# Patient Record
Sex: Male | Born: 1985 | Race: White | Hispanic: No | Marital: Single | Smoking: Never smoker
Health system: Southern US, Community
[De-identification: ages and names within clinical notes are randomized; demographics above are authoritative.]

## PROBLEM LIST (undated history)

## (undated) HISTORY — PX: TONSILLECTOMY: SUR1361

---

## 2004-03-30 ENCOUNTER — Emergency Department (HOSPITAL_COMMUNITY): Admission: EM | Admit: 2004-03-30 | Discharge: 2004-03-30 | Payer: Self-pay | Admitting: Emergency Medicine

## 2004-04-05 ENCOUNTER — Emergency Department (HOSPITAL_COMMUNITY): Admission: EM | Admit: 2004-04-05 | Discharge: 2004-04-06 | Payer: Self-pay | Admitting: Emergency Medicine

## 2004-04-10 ENCOUNTER — Ambulatory Visit: Payer: Self-pay | Admitting: Internal Medicine

## 2004-04-17 ENCOUNTER — Ambulatory Visit: Payer: Self-pay | Admitting: Internal Medicine

## 2005-12-22 ENCOUNTER — Emergency Department (HOSPITAL_COMMUNITY): Admission: EM | Admit: 2005-12-22 | Discharge: 2005-12-22 | Payer: Self-pay | Admitting: Emergency Medicine

## 2005-12-23 ENCOUNTER — Emergency Department (HOSPITAL_COMMUNITY): Admission: EM | Admit: 2005-12-23 | Discharge: 2005-12-23 | Payer: Self-pay | Admitting: Emergency Medicine

## 2007-12-06 ENCOUNTER — Emergency Department (HOSPITAL_BASED_OUTPATIENT_CLINIC_OR_DEPARTMENT_OTHER): Admission: EM | Admit: 2007-12-06 | Discharge: 2007-12-06 | Payer: Self-pay | Admitting: Emergency Medicine

## 2011-02-20 LAB — CBC
HCT: 45.4
Hemoglobin: 15.9
MCHC: 34.9
MCV: 85
Platelets: 202
RBC: 5.35
RDW: 12.4
WBC: 6.5

## 2011-02-20 LAB — DIFFERENTIAL
Basophils Absolute: 0
Basophils Relative: 1
Eosinophils Absolute: 0.3
Eosinophils Relative: 4
Lymphocytes Relative: 23
Lymphs Abs: 1.5
Monocytes Absolute: 0.4
Monocytes Relative: 7
Neutro Abs: 4.3
Neutrophils Relative %: 66

## 2011-02-20 LAB — BASIC METABOLIC PANEL
BUN: 9
CO2: 27
Calcium: 9.7
Chloride: 106
Creatinine, Ser: 0.9
GFR calc Af Amer: >60
GFR calc non Af Amer: >60
Glucose, Bld: 107 — ABNORMAL HIGH
Potassium: 4.4
Sodium: 142

## 2011-02-20 LAB — D-DIMER, QUANTITATIVE: D-Dimer, Quant: 0.28

## 2014-08-27 ENCOUNTER — Emergency Department (HOSPITAL_COMMUNITY)
Admission: EM | Admit: 2014-08-27 | Discharge: 2014-08-27 | Disposition: A | Discharge status: Home / Self Care | Payer: Self-pay | Attending: Emergency Medicine | Admitting: Emergency Medicine

## 2014-08-27 ENCOUNTER — Emergency Department (HOSPITAL_COMMUNITY): Payer: Self-pay

## 2014-08-27 ENCOUNTER — Encounter (HOSPITAL_COMMUNITY): Payer: Self-pay | Admitting: Cardiology

## 2014-08-27 DIAGNOSIS — Z880 Allergy status to penicillin: Secondary | ICD-10-CM | POA: Insufficient documentation

## 2014-08-27 DIAGNOSIS — L600 Ingrowing nail: Secondary | ICD-10-CM | POA: Insufficient documentation

## 2014-08-27 DIAGNOSIS — Z792 Long term (current) use of antibiotics: Secondary | ICD-10-CM | POA: Insufficient documentation

## 2014-08-27 DIAGNOSIS — M25571 Pain in right ankle and joints of right foot: Secondary | ICD-10-CM | POA: Insufficient documentation

## 2014-08-27 MED ORDER — BUPIVACAINE HCL (PF) 0.25 % IJ SOLN
20.0000 mL | Freq: Once | INTRAMUSCULAR | Status: AC
  Administered 2014-08-27: 20 mL
  Filled 2014-08-27: qty 30

## 2014-08-27 MED ORDER — PREDNISONE 10 MG PO TABS: 20.0000 mg | ORAL_TABLET | Freq: Two times a day (BID) | ORAL | Status: DC

## 2014-08-27 MED ORDER — SULFAMETHOXAZOLE-TRIMETHOPRIM 800-160 MG PO TABS: 1.0000 | ORAL_TABLET | Freq: Two times a day (BID) | ORAL | Status: AC

## 2014-08-27 MED ORDER — LIDOCAINE HCL (PF) 1 % IJ SOLN
5.0000 mL | Freq: Once | INTRAMUSCULAR | Status: AC
  Administered 2014-08-27: 5 mL
  Filled 2014-08-27: qty 5

## 2014-08-27 MED ORDER — HYDROCODONE-ACETAMINOPHEN 5-325 MG PO TABS: 1.0000 | ORAL_TABLET | ORAL | Status: DC | PRN

## 2014-08-27 MED ORDER — POVIDONE-IODINE 10 % EX SOLN
CUTANEOUS | Status: AC
  Administered 2014-08-27: 14:00:00
  Filled 2014-08-27: qty 118

## 2014-08-27 MED ORDER — PREDNISONE 20 MG PO TABS
40.0000 mg | ORAL_TABLET | Freq: Once | ORAL | Status: AC
  Administered 2014-08-27: 40 mg via ORAL
  Filled 2014-08-27: qty 2

## 2014-08-27 NOTE — ED Notes (Signed)
Right foot swollen and red.

## 2014-08-27 NOTE — ED Provider Notes (Signed)
CSN: [id number]     Arrival date & time 08/27/14  1043 History  This chart was scribed for non-physician practitioner Kerrie BuffaloHope Gaddiel Cullens, NP working with Donnetta HutchingBrian Cook, MD by Murriel HopperAlec Bankhead, ED Scribe. This patient was seen in room APFT21/APFT21 and the patient's care was started at 12:01 PM.     Chief Complaint  Patient presents with  . Foot Pain     The history is provided by the patient. No language interpreter was used.     HPI Comments: Kenneth Schneider is a 29 y.o. male who presents to the Emergency Department complaining of worsening right foot, ankle, and lower leg pain that has been present for a week. Pt states that his pain began with what seemed to be an ingrown toenail and then proceeded to radiate to his ankle and lower leg. Pt states he cannot perform weight-bearing activity, and denies any known injury to the area. Pt states he has not taken any medication to treat the pain.    History reviewed. No pertinent past medical history. History reviewed. No pertinent past surgical history. History reviewed. No pertinent family history. History  Substance Use Topics  . Smoking status: Never Smoker   . Smokeless tobacco: Not on file  . Alcohol Use: Yes     Comment: rarely     Review of Systems  Musculoskeletal: Positive for arthralgias.       Right foot/ankle pain and ingrown infected great toenail.  all other systems negative    Allergies  Penicillins  Home Medications   Prior to Admission medications   Medication Sig Start Date End Date Taking? Authorizing Provider  HYDROcodone-acetaminophen (NORCO/VICODIN) 5-325 MG per tablet Take 1 tablet by mouth every 4 (four) hours as needed. 08/27/14   Bodhi Stenglein Orlene OchM Gwyndolyn Guilford, NP  predniSONE (DELTASONE) 10 MG tablet Take 2 tablets (20 mg total) by mouth 2 (two) times daily with a meal. 08/27/14   Buzz Axel Orlene OchM Azile Minardi, NP  sulfamethoxazole-trimethoprim (BACTRIM DS,SEPTRA DS) 800-160 MG per tablet Take 1 tablet by mouth 2 (two) times daily. 08/27/14 09/03/14  Hoang Reich  Orlene OchM Nykole Matos, NP   BP 124/71 mmHg  Pulse 79  Temp(Src) 98.2 F (36.8 C) (Oral)  Resp 20  Ht 5\' 10"  (1.778 m)  Wt 230 lb (104.327 kg)  BMI 33.00 kg/m2  SpO2 100% Physical Exam  Constitutional: He is oriented to person, place, and time. He appears well-developed and well-nourished.  HENT:  Head: Normocephalic and atraumatic.  Cardiovascular: Normal rate.   Pulmonary/Chest: Effort normal.  Abdominal: He exhibits no distension.  Musculoskeletal:       Right foot: There is tenderness and swelling. Decreased range of motion: due to pain.       Feet:  2+ pedal pulse Adequate circulation Tender medial aspect of the right foot that radiates to the medial malleolus, full passive range of motion with minimal pain. There is small amount of swelling noted to the medial aspect of the foot.   Infected ingrown toenail to great toe  No calf pain on exam  Referred pain from ankle up calf only with ambulation, unable to reproduce the pain with squeezing the calf.   Neurological: He is alert and oriented to person, place, and time.  Skin: Skin is warm and dry.  Psychiatric: He has a normal mood and affect.  Nursing note and vitals reviewed.   ED Course  NAIL REMOVAL Date/Time: 08/27/2014 2:58 PM Performed by: Janne NapoleonNEESE, Cyndia Degraff M Authorized by: Janne NapoleonNEESE, Tomislav Micale M Consent: Verbal consent obtained. Risks and  benefits: risks, benefits and alternatives were discussed Consent given by: patient Patient understanding: patient states understanding of the procedure being performed Required items: required blood products, implants, devices, and special equipment available Patient identity confirmed: verbally with patient Location: right foot Location details: right big toe Anesthesia: digital block Local anesthetic: bupivacaine 0.25% without epinephrine and lidocaine 1% without epinephrine Anesthetic total: 5 ml Patient sedated: no Preparation: skin prepped with Betadine and sterile field established Nail removal  amount: 1/8. Wedge excision of skin of nail fold: yes Nail bed sutured: no Nail matrix removed: partial Dressing: 4x4 Patient tolerance: Patient tolerated the procedure well with no immediate complications   (including critical care time)   DIAGNOSTIC STUDIES: Oxygen Saturation is 100% on room air, normal by my interpretation.    COORDINATION OF CARE: 12:05 PM Discussed treatment plan with pt at bedside and pt agreed to plan.  Dr. Adriana Simas in to examine the patient. He agrees with assessment/plan.  ASO, pain management, antibiotics Labs Review Labs Reviewed - No data to display  Imaging Review Dg Foot Complete Right  08/27/2014   CLINICAL DATA:  Right foot pain for 1 week, swelling, no trauma  EXAM: RIGHT FOOT COMPLETE - 3+ VIEW  COMPARISON:  None.  FINDINGS: There is no evidence of fracture or dislocation. There is no evidence of arthropathy or other focal bone abnormality. Soft tissues are unremarkable.  IMPRESSION: Negative.   Electronically Signed   By: Christiana Pellant M.D.   On: 08/27/2014 12:34     MDM  29 y.o. male with infected ingrown toe nail of the right great toe and right medial foot/ankle pain. Suspect foot/ankle pain not related to the infected toe. Will treat for infection of the toe and treat for gouty arthritis of the foot/ankle. Discussed with the patient and all questioned fully answered. He will call me if any problems arise. Stable for d/c without neurovascular compromise.   Final diagnoses:  Ingrown right big toenail  Right ankle pain   I personally performed the services described in this documentation, which was scribed in my presence. The recorded information has been reviewed and is accurate.    Miner, NP 08/28/14 1332  Donnetta Hutching, MD 08/29/14 [phone]

## 2014-08-27 NOTE — Discharge Instructions (Signed)
Take the medications as directed. Do not take the narcotic if driving as it will make you sleepy.   Arthritis, Nonspecific Arthritis is pain, redness, warmth, or puffiness (inflammation) of a joint. The joint may be stiff or hurt when you move it. One or more joints may be affected. There are many types of arthritis. Your doctor may not know what type you have right away. The most common cause of arthritis is wear and tear on the joint (osteoarthritis). HOME CARE   Only take medicine as told by your doctor.  Rest the joint as much as possible.  Raise (elevate) your joint if it is puffy.  Use crutches if the painful joint is in your leg.  Drink enough fluids to keep your pee (urine) clear or pale yellow.  Follow your doctor's diet instructions.  Use cold packs for very bad joint pain for 10 to 15 minutes every hour. Ask your doctor if it is okay for you to use hot packs.  Exercise as told by your doctor.  Take a warm shower if you have stiffness in the morning.  Move your sore joints throughout the day. GET HELP RIGHT AWAY IF:   You have a fever.  You have very bad joint pain, puffiness, or redness.  You have many joints that are painful and puffy.  You are not getting better with treatment.  You have very bad back pain or leg weakness.  You cannot control when you poop (bowel movement) or pee (urinate).  You do not feel better in 24 hours or are getting worse.  You are having side effects from your medicine. MAKE SURE YOU:   Understand these instructions.  Will watch your condition.  Will get help right away if you are not doing well or get worse. Document Released: 08/06/2009 Document Revised: 11/11/2011 Document Reviewed: 08/06/2009 [facility] Patient Information 2015 [place], [place]. This information is not intended to replace advice given to you by your health care provider. Make sure you discuss any questions you have with your health care provider.

## 2015-02-18 ENCOUNTER — Emergency Department (HOSPITAL_COMMUNITY)
Admission: EM | Admit: 2015-02-18 | Discharge: 2015-02-18 | Disposition: A | Discharge status: Home / Self Care | Payer: Self-pay | Attending: Emergency Medicine | Admitting: Emergency Medicine

## 2015-02-18 ENCOUNTER — Emergency Department (HOSPITAL_COMMUNITY): Payer: Self-pay

## 2015-02-18 ENCOUNTER — Encounter (HOSPITAL_COMMUNITY): Payer: Self-pay | Admitting: *Deleted

## 2015-02-18 DIAGNOSIS — N492 Inflammatory disorders of scrotum: Secondary | ICD-10-CM | POA: Insufficient documentation

## 2015-02-18 DIAGNOSIS — R6883 Chills (without fever): Secondary | ICD-10-CM | POA: Insufficient documentation

## 2015-02-18 DIAGNOSIS — N50819 Testicular pain, unspecified: Secondary | ICD-10-CM

## 2015-02-18 DIAGNOSIS — Z880 Allergy status to penicillin: Secondary | ICD-10-CM | POA: Insufficient documentation

## 2015-02-18 LAB — CBC WITH DIFFERENTIAL/PLATELET
Basophils Absolute: 0 10*3/uL (ref 0.0–0.1)
Basophils Relative: 0 %
Eosinophils Absolute: 0.5 10*3/uL (ref 0.0–0.7)
Eosinophils Relative: 4 %
HCT: 41.6 % (ref 39.0–52.0)
Hemoglobin: 13.9 g/dL (ref 13.0–17.0)
Lymphocytes Relative: 18 %
Lymphs Abs: 2.1 10*3/uL (ref 0.7–4.0)
MCH: 29.8 pg (ref 26.0–34.0)
MCHC: 33.4 g/dL (ref 30.0–36.0)
MCV: 89.1 fL (ref 78.0–100.0)
Monocytes Absolute: 1.1 10*3/uL — ABNORMAL HIGH (ref 0.1–1.0)
Monocytes Relative: 9 %
Neutro Abs: 8.2 10*3/uL — ABNORMAL HIGH (ref 1.7–7.7)
Neutrophils Relative %: 69 %
Platelets: 190 10*3/uL (ref 150–400)
RBC: 4.67 MIL/uL (ref 4.22–5.81)
RDW: 13.5 % (ref 11.5–15.5)
WBC: 11.9 10*3/uL — ABNORMAL HIGH (ref 4.0–10.5)

## 2015-02-18 LAB — BASIC METABOLIC PANEL
Anion gap: 6 (ref 5–15)
BUN: 12 mg/dL (ref 6–20)
CO2: 27 mmol/L (ref 22–32)
Calcium: 8.5 mg/dL — ABNORMAL LOW (ref 8.9–10.3)
Chloride: 106 mmol/L (ref 101–111)
Creatinine, Ser: 0.86 mg/dL (ref 0.61–1.24)
GFR calc Af Amer: >60 mL/min (ref >60)
GFR calc non Af Amer: >60 mL/min (ref >60)
Glucose, Bld: 104 mg/dL — ABNORMAL HIGH (ref 65–99)
Potassium: 4.2 mmol/L (ref 3.5–5.1)
Sodium: 139 mmol/L (ref 135–145)

## 2015-02-18 MED ORDER — SULFAMETHOXAZOLE-TRIMETHOPRIM 800-160 MG PO TABS: 1.0000 | ORAL_TABLET | Freq: Two times a day (BID) | ORAL | Status: AC

## 2015-02-18 MED ORDER — CEFTRIAXONE SODIUM 1 G IJ SOLR
1.0000 g | Freq: Once | INTRAMUSCULAR | Status: AC
  Administered 2015-02-18: 1 g via INTRAVENOUS
  Filled 2015-02-18: qty 10

## 2015-02-18 MED ORDER — ONDANSETRON HCL 4 MG/2ML IJ SOLN
4.0000 mg | Freq: Once | INTRAMUSCULAR | Status: DC
  Filled 2015-02-18: qty 2

## 2015-02-18 MED ORDER — ONDANSETRON HCL 4 MG/2ML IJ SOLN
4.0000 mg | Freq: Once | INTRAMUSCULAR | Status: AC
  Administered 2015-02-18: 4 mg via INTRAVENOUS

## 2015-02-18 MED ORDER — CEPHALEXIN 500 MG PO CAPS: 500.0000 mg | ORAL_CAPSULE | Freq: Four times a day (QID) | ORAL | Status: DC

## 2015-02-18 MED ORDER — VANCOMYCIN HCL IN DEXTROSE 1-5 GM/200ML-% IV SOLN
1000.0000 mg | Freq: Once | INTRAVENOUS | Status: AC
  Administered 2015-02-18: 1000 mg via INTRAVENOUS
  Filled 2015-02-18: qty 200

## 2015-02-18 MED ORDER — SODIUM CHLORIDE 0.9 % IV BOLUS (SEPSIS)
1000.0000 mL | Freq: Once | INTRAVENOUS | Status: AC
  Administered 2015-02-18: 1000 mL via INTRAVENOUS

## 2015-02-18 NOTE — ED Provider Notes (Signed)
CSN: [id number]     Arrival date & time 02/18/15  1044 History  This chart was scribed for Kenneth Octave, MD by Murriel Hopper, ED Scribe. This patient was seen in room APA11/APA11 and the patient's care was started at 11:28 AM.    Chief Complaint  Patient presents with  . Abscess      The history is provided by the patient. No language interpreter was used.   HPI Comments: Kenneth Schneider is a 29 y.o. male who presents to the Emergency Department complaining of an abscess on the right side of his scrotum that has been present for 3 days with associated chills. Pt states he had an area on his scrotum that looked like a pimple that he popped 3 days ago, and reports the area has increased in size, tenderness, and redness since then. Pt states he has used a warm compress and taken warm baths for the last three days but states that doing so has not helped with his symptoms. Pt denies hematuria or dysuria, penile discharge, fever, vomiting. Pt states he is sexually active with one partner and had sex last 4-5 days ago. Pt states he is allergic to ibuprofen and advil.    History reviewed. No pertinent past medical history. History reviewed. No pertinent past surgical history. No family history on file. Social History  Substance Use Topics  . Smoking status: Never Smoker   . Smokeless tobacco: None  . Alcohol Use: Yes     Comment: rarely     Review of Systems  A complete 10 system review of systems was obtained and all systems are negative except as noted in the HPI and PMH.    Allergies  Codeine; Ibuprofen; Penicillins; and Vancomycin  Home Medications   Prior to Admission medications   Medication Sig Start Date End Date Taking? Authorizing Provider  cephALEXin (KEFLEX) 500 MG capsule Take 1 capsule (500 mg total) by mouth 4 (four) times daily. 02/18/15   Kenneth Octave, MD  predniSONE (DELTASONE) 10 MG tablet Take 2 tablets (20 mg total) by mouth 2 (two) times daily with a  meal. Patient not taking: Reported on 02/18/2015 08/27/14   [name], NP  sulfamethoxazole-trimethoprim (BACTRIM DS,SEPTRA DS) 800-160 MG per tablet Take 1 tablet by mouth 2 (two) times daily. 02/18/15 02/25/15  Kenneth Octave, MD   BP 120/74 mmHg  Pulse 99  Temp(Src) 98.4 F (36.9 C) (Oral)  Resp 18  Ht  (1.778 m)  Wt 225 lb (102.059 kg)  BMI 32.28 kg/m2  SpO2 99% Physical Exam  Constitutional: He is oriented to person, place, and time. He appears well-developed and well-nourished. No distress.  HENT:  Head: Normocephalic and atraumatic.  Mouth/Throat: Oropharynx is clear and moist. No oropharyngeal exudate.  Eyes: Conjunctivae and EOM are normal. Pupils are equal, round, and reactive to light.  Neck: Normal range of motion. Neck supple.  No meningismus.  Cardiovascular: Normal rate, regular rhythm, normal heart sounds and intact distal pulses.   No murmur heard. Pulmonary/Chest: Effort normal and breath sounds normal. No respiratory distress.  Abdominal: Soft. There is no tenderness. There is no rebound and no guarding.  Genitourinary: No penile tenderness.  No testicular tenderness  Musculoskeletal: Normal range of motion. He exhibits no edema or tenderness.  Neurological: He is alert and oriented to person, place, and time. No cranial nerve deficit. He exhibits normal muscle tone. Coordination normal.  No ataxia on finger to nose bilaterally. No pronator drift. 5/5 strength throughout.  CN 2-12 intact. Negative Romberg. Equal grip strength. Sensation intact. Gait is normal.   Skin: Skin is warm.  3x4 cm area of erythema and induration to right side of scrotum  Psychiatric: He has a normal mood and affect. His behavior is normal.  Nursing note and vitals reviewed.   ED Course  Procedures (including critical care time)  DIAGNOSTIC STUDIES: Oxygen Saturation is 100% on room air, normal by my interpretation.    COORDINATION OF CARE: 11:37 AM Discussed treatment plan  with pt at bedside and pt agreed to plan.   Labs Review Labs Reviewed  CBC WITH DIFFERENTIAL/PLATELET - Abnormal; Notable for the following:    WBC 11.9 (*)    Neutro Abs 8.2 (*)    Monocytes Absolute 1.1 (*)    All other components within normal limits  BASIC METABOLIC PANEL - Abnormal; Notable for the following:    Glucose, Bld 104 (*)    Calcium 8.5 (*)    All other components within normal limits  URINALYSIS, ROUTINE W REFLEX MICROSCOPIC (NOT AT [facility])    Imaging Review US Scrotum  02/18/2015   CLINICAL DATA:  Patient with bilateral testicular pain.  EXAM: ULTRASOUND OF SCROTUM  TECHNIQUE: Complete ultrasound examination of the testicles, epididymis, and other scrotal structures was performed.  COMPARISON:  CT 07/16/2004  FINDINGS: Right testicle  Measurements: 4.3 x 2.2 x 3.0 cm. No mass or microlithiasis visualized.  Left testicle  Measurements: 4.4 x 2.4 x 2.4 cm. No mass or microlithiasis visualized.  Right epididymis: Probable small epididymal head cysts versus spermatoceles.  Left epididymis: Probable small epididymal head cysts versus spermatoceles.  Hydrocele:  None visualized.  Varicocele:  None visualized.  There is marked thickening of scrotum, particularly overlying the right testicle.  IMPRESSION: Marked skin thickening of the scrotum, particularly overlying right testicle. Findings are concerning for infectious process such as cellulitis.  Normal sonographic appearance of the testicles bilaterally without evidence for torsion.   Electronically Signed   By: Annia Belt M.D.   On: 02/18/2015 12:41   [place] Art/ven Flow Abd Pelv Doppler  02/18/2015   CLINICAL DATA:  Patient with bilateral testicular pain.  EXAM: ULTRASOUND OF SCROTUM  TECHNIQUE: Complete ultrasound examination of the testicles, epididymis, and other scrotal structures was performed.  COMPARISON:  CT 07/16/2004  FINDINGS: Right testicle  Measurements: 4.3 x 2.2 x 3.0 cm. No mass or microlithiasis visualized.  Left  testicle  Measurements: 4.4 x 2.4 x 2.4 cm. No mass or microlithiasis visualized.  Right epididymis: Probable small epididymal head cysts versus spermatoceles.  Left epididymis: Probable small epididymal head cysts versus spermatoceles.  Hydrocele:  None visualized.  Varicocele:  None visualized.  There is marked thickening of scrotum, particularly overlying the right testicle.  IMPRESSION: Marked skin thickening of the scrotum, particularly overlying right testicle. Findings are concerning for infectious process such as cellulitis.  Normal sonographic appearance of the testicles bilaterally without evidence for torsion.   Electronically Signed   By: Annia Belt M.D.   On: 02/18/2015 12:41   I have personally reviewed and evaluated these images and lab results as part of my medical decision-making.   EKG Interpretation None      MDM   Final diagnoses:  Testicle pain  Cellulitis of scrotum   pain, redness and swelling to right scrotum for 3 days. No fever or vomiting. Patient is not diabetic.  Area of cellulitis to right hemiscrotum. There is no fluctuance. There is no drainable fluid collection ultrasound. Blood  flow to testicles normal.  Patient given IV vancomycin and Rocephin in the ED. Discussed with Dr. Vernie Ammons of urology. He agrees with outpatient follow-up given patient's well appearance.  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 No difficulty breathing or swallowing. Added to allergy list.  Bactrim and keflex for home use. followup with urology in 2 days. Return precautions discussed.  BP 120/74 mmHg  Pulse 99  Temp(Src) 98.4 F (36.9 C) (Oral)  Resp 18  Ht  (1.778 m)  Wt 225 lb (102.059 kg)  BMI 32.28 kg/m2  SpO2 99%     I personally performed the services described in this documentation, which was scribed in my presence. The recorded information has been reviewed and is accurate.   Kenneth Octave, MD 02/18/15 [phone]

## 2015-02-18 NOTE — Discharge Instructions (Signed)
Cellulitis Take the antibiotics as prescribed. Follow up with the urologist this week. Return to the ED if you develop new or worsening symptoms. Cellulitis is an infection of the skin and the tissue beneath it. The infected area is usually red and tender. Cellulitis occurs most often in the arms and lower legs.  CAUSES  Cellulitis is caused by bacteria that enter the skin through cracks or cuts in the skin. The most common types of bacteria that cause cellulitis are staphylococci and streptococci. SIGNS AND SYMPTOMS   Redness and warmth.  Swelling.  Tenderness or pain.  Fever. DIAGNOSIS  Your health care provider can usually determine what is wrong based on a physical exam. Blood tests may also be done. TREATMENT  Treatment usually involves taking an antibiotic medicine. HOME CARE INSTRUCTIONS   Take your antibiotic medicine as directed by your health care provider. Finish the antibiotic even if you start to feel better.  Keep the infected arm or leg elevated to reduce swelling.  Apply a warm cloth to the affected area up to 4 times per day to relieve pain.  Take medicines only as directed by your health care provider.  Keep all follow-up visits as directed by your health care provider. SEEK MEDICAL CARE IF:   You notice red streaks coming from the infected area.  Your red area gets larger or turns dark in color.  Your bone or joint underneath the infected area becomes painful after the skin has healed.  Your infection returns in the same area or another area.  You notice a swollen bump in the infected area.  You develop new symptoms.  You have a fever. SEEK IMMEDIATE MEDICAL CARE IF:   You feel very sleepy.  You develop vomiting or diarrhea.  You have a general ill feeling (malaise) with muscle aches and pains. MAKE SURE YOU:   Understand these instructions.  Will watch your condition.  Will get help right away if you are not doing well or get  worse. Document Released: 02/19/2005 Document Revised: 09/26/2013 Document Reviewed: 07/28/2011 [facility] Patient Information 2015 [place], [place]. This information is not intended to replace advice given to you by your health care provider. Make sure you discuss any questions you have with your health care provider.

## 2015-02-18 NOTE — ED Notes (Signed)
Pt states abscess to right scrotum first noticed 3 days ago. Pt denies drainage.

## 2016-09-20 IMAGING — DX DG FOOT COMPLETE 3+V*R*
3 series · 3 of 3 positions shown · non-contrast
Comparison: None.

CLINICAL DATA: Right foot pain for 1 week, swelling, no trauma

EXAM:
RIGHT FOOT COMPLETE - 3+ VIEW

[foot ap]
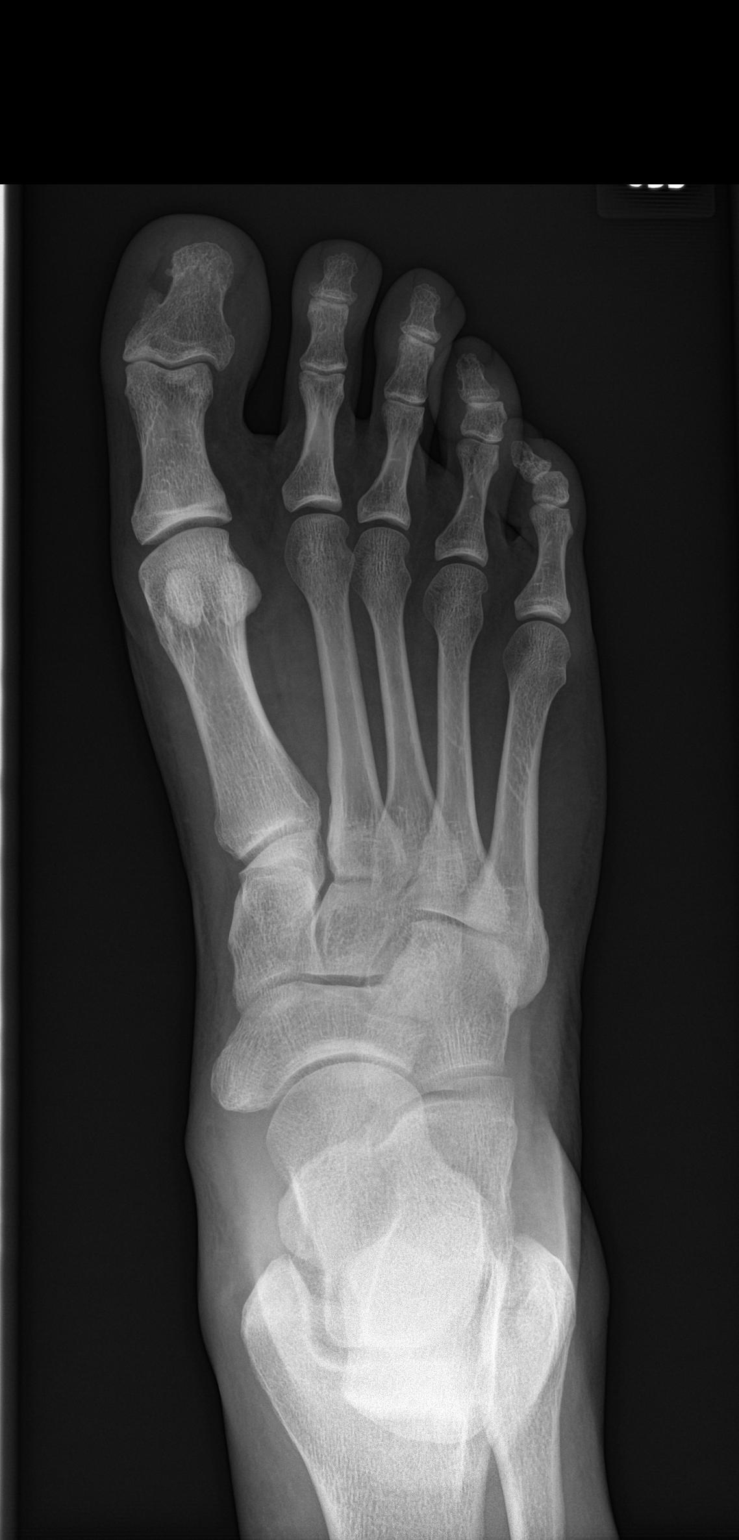

[foot obl]
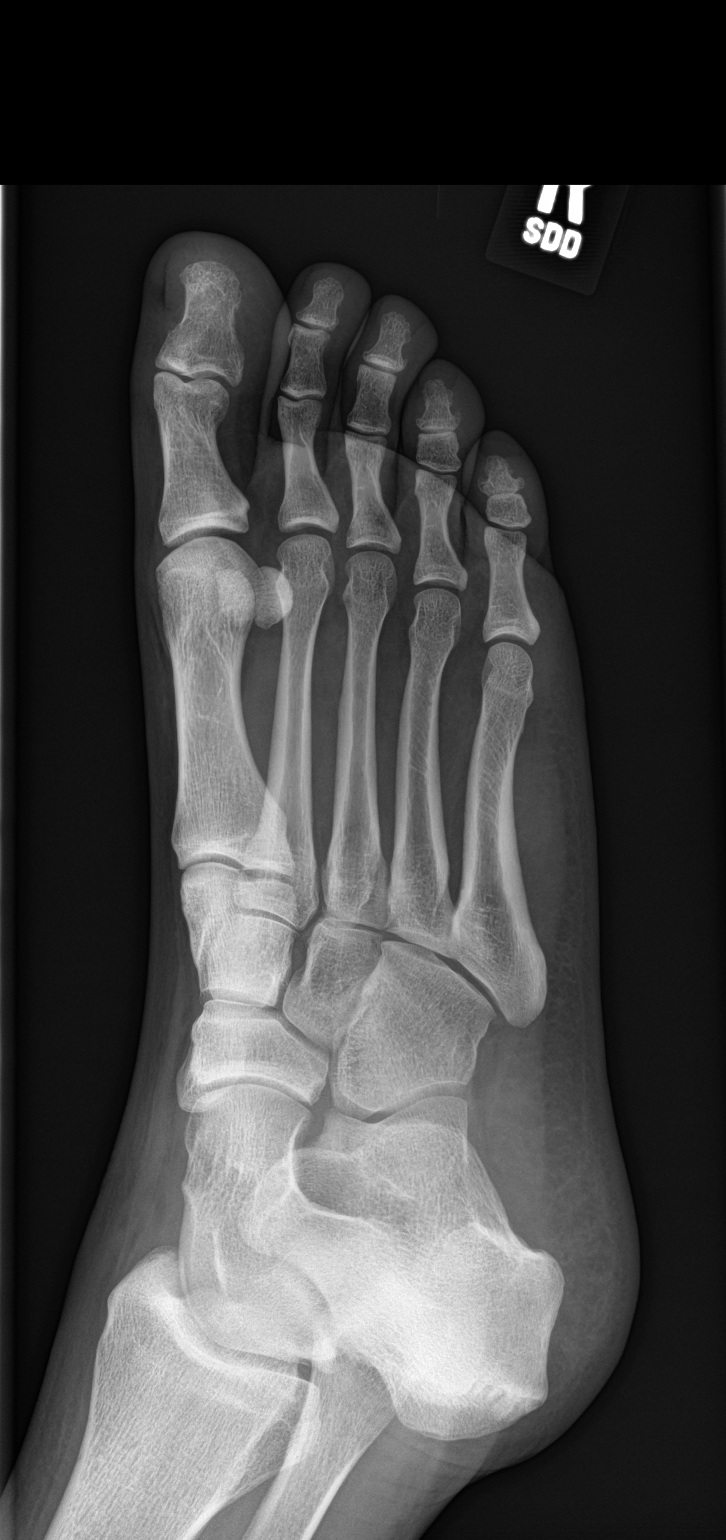

[foot lat]
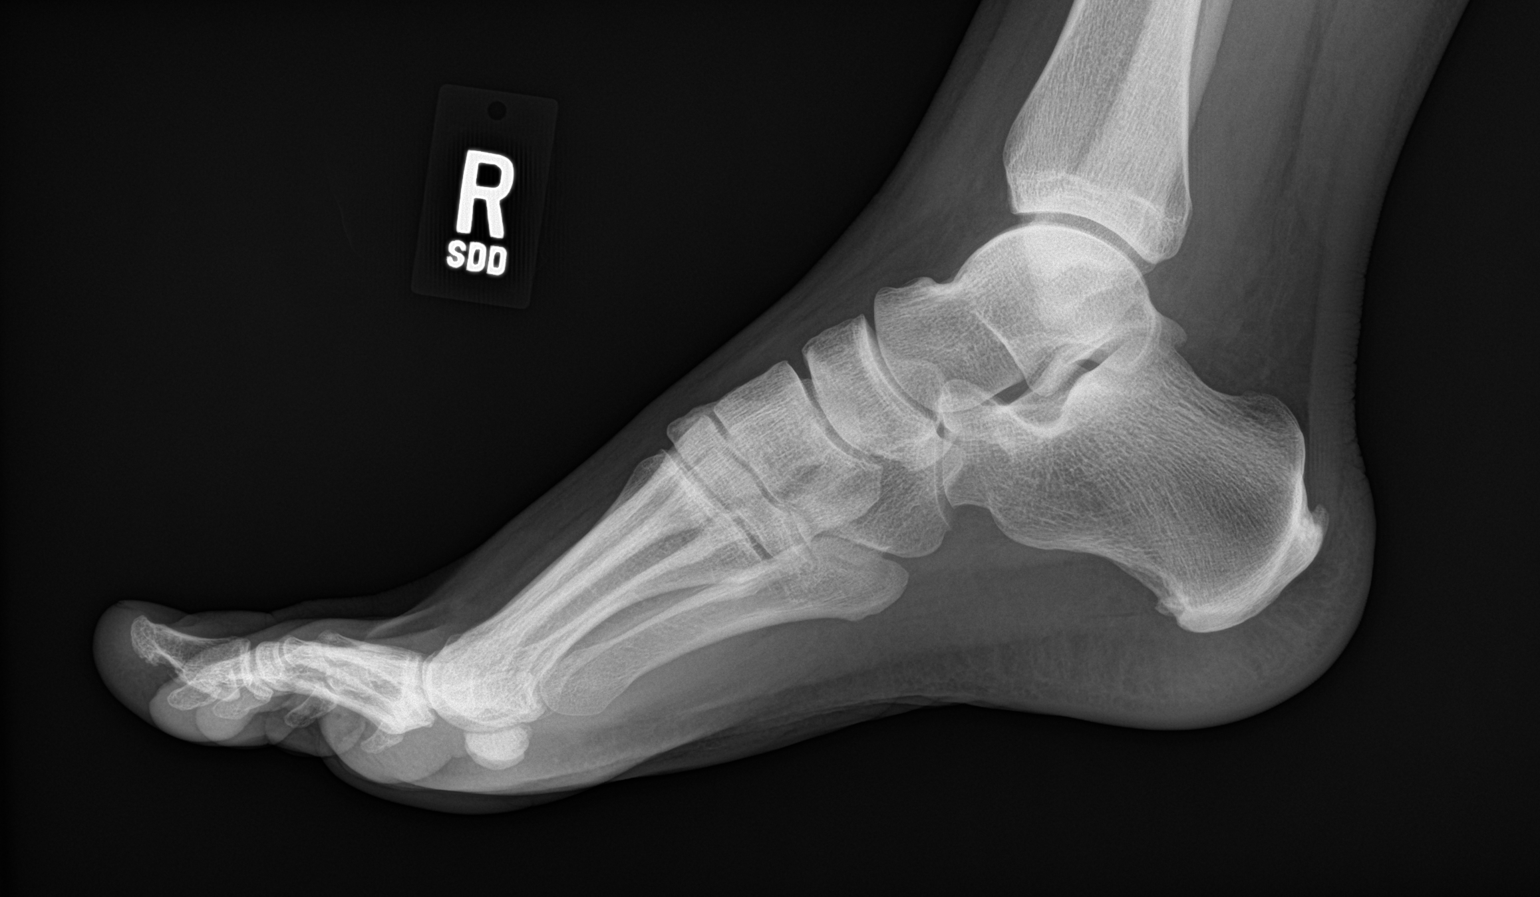

[3 of 3 positions shown; findings below may reference images not displayed]

FINDINGS: There is no evidence of fracture or dislocation. There is no
evidence of arthropathy or other focal bone abnormality. Soft
tissues are unremarkable.
IMPRESSION: Negative.

## 2019-01-07 ENCOUNTER — Other Ambulatory Visit: Payer: Self-pay

## 2019-01-07 DIAGNOSIS — Z20822 Contact with and (suspected) exposure to covid-19: Secondary | ICD-10-CM

## 2019-01-09 LAB — NOVEL CORONAVIRUS, NAA: SARS-CoV-2, NAA: NOT DETECTED

## 2019-03-18 ENCOUNTER — Other Ambulatory Visit: Payer: Self-pay

## 2019-03-18 DIAGNOSIS — Z20822 Contact with and (suspected) exposure to covid-19: Secondary | ICD-10-CM

## 2019-03-20 LAB — NOVEL CORONAVIRUS, NAA: SARS-CoV-2, NAA: NOT DETECTED

## 2020-04-03 ENCOUNTER — Ambulatory Visit: Payer: Self-pay

## 2020-04-03 ENCOUNTER — Other Ambulatory Visit: Payer: Self-pay

## 2020-04-03 ENCOUNTER — Ambulatory Visit
Admission: EM | Admit: 2020-04-03 | Discharge: 2020-04-03 | Disposition: A | Discharge status: Home / Self Care | Payer: Commercial Managed Care - PPO | Attending: Emergency Medicine | Admitting: Emergency Medicine

## 2020-04-03 DIAGNOSIS — R059 Cough, unspecified: Secondary | ICD-10-CM | POA: Diagnosis not present

## 2020-04-03 DIAGNOSIS — Z1152 Encounter for screening for COVID-19: Secondary | ICD-10-CM

## 2020-04-03 DIAGNOSIS — J019 Acute sinusitis, unspecified: Secondary | ICD-10-CM

## 2020-04-03 MED ORDER — BENZONATATE 100 MG PO CAPS: 100.0000 mg | ORAL_CAPSULE | Freq: Three times a day (TID) | ORAL | 0 refills | Status: AC

## 2020-04-03 MED ORDER — LEVOFLOXACIN 500 MG PO TABS: 500.0000 mg | ORAL_TABLET | Freq: Every day | ORAL | 0 refills | Status: AC

## 2020-04-03 NOTE — ED Triage Notes (Signed)
Pt presents with cough and fever with some sob , has had 3 negative covid test . Had doxy called in yesterday but states having hard time taking

## 2020-04-03 NOTE — Discharge Instructions (Signed)
Exam within normal limits, heart rate mildly elevated.  Pulse ox 95% If you are concerned for blood clot please go to ED for further evaluation and management.  We are unable to rule that out in urgent care setting We will hold on x-ray today and trial a different antibiotic for possible sinus infection  COVID testing ordered.  It will take between 1-3 days for test results.  Someone will contact you regarding abnormal results.    In the meantime: You should remain isolated in your home for 10 days from symptom onset AND greater than 72 hours after symptoms resolution (absence of fever without the use of fever-reducing medication and improvement in respiratory symptoms), whichever is longer Get plenty of rest and push fluids Tessalon Perles prescribed for cough Levaquin for sinus infection Use OTC zyrtec for nasal congestion, runny nose, and/or sore throat Use OTC flonase for nasal congestion and runny nose Use medications daily for symptom relief Use OTC medications like ibuprofen or tylenol as needed fever or pain Call or go to the ED if you have any new or worsening symptoms such as fever, worsening cough, shortness of breath, chest tightness, chest pain, turning blue, changes in mental status, etc..Marland Kitchen

## 2020-04-03 NOTE — ED Provider Notes (Signed)
[facility]   [id number] 04/03/20 Arrival Time: 1519   CC: Cough  SUBJECTIVE: History from: patient.  JULIES CARMICKLE is a 34 y.o. male who presents with sinus pain, pressure, productive cough with green sputum, SOB, and fever, tmax of 101.8, x 1 week.  Denies known sick exposure to COVID, flu or strep.  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  Had e-visit a couple of days ago and treated with doxycycline for possible sinus infection.  Has taken 1-2 doses.  States he has hallucinations with doxycycline and would like another antibiotic.  Symptoms are made worse at night.  Denies previous symptoms in the past.   Denies sore throat, wheezing, chest pain, nausea, changes in bowel or bladder habits.    Has taken three rapid/ home tests over the past week that have been negative.    ROS: As per HPI.  All other pertinent ROS negative.     History reviewed. No pertinent past medical history. History reviewed. No pertinent surgical history. Allergies  Allergen Reactions  . Codeine Hives and Rash  . Ibuprofen Rash  . Penicillins Rash  . Vancomycin Rash   No current facility-administered medications on file prior to encounter.   No current outpatient medications on file prior to encounter.   Social History   Socioeconomic History  . Marital status: Single    Spouse name: Not on file  . Number of children: Not on file  . Years of education: Not on file  . Highest education level: Not on file  Occupational History  . Not on file  Tobacco Use  . Smoking status: Never Smoker  Substance and Sexual Activity  . Alcohol use: Yes    Comment: rarely   . Drug use: No  . Sexual activity: Not on file  Other Topics Concern  . Not on file  Social History Narrative  . Not on file   Social Determinants of Health   Financial Resource Strain:   . Difficulty of Paying Living Expenses: Not on file  Food Insecurity:   . Worried About Programme researcher, broadcasting/film/video in the Last  Year: Not on file  . Ran Out of Food in the Last Year: Not on file  Transportation Needs:   . Lack of Transportation (Medical): Not on file  . Lack of Transportation (Non-Medical): Not on file  Physical Activity:   . Days of Exercise per Week: Not on file  . Minutes of Exercise per Session: Not on file  Stress:   . Feeling of Stress : Not on file  Social Connections:   . Frequency of Communication with Friends and Family: Not on file  . Frequency of Social Gatherings with Friends and Family: Not on file  . Attends Religious Services: Not on file  . Active Member of Clubs or Organizations: Not on file  . Attends Banker Meetings: Not on file  . Marital Status: Not on file  Intimate Partner Violence:   . Fear of Current or Ex-Partner: Not on file  . Emotionally Abused: Not on file  . Physically Abused: Not on file  . Sexually Abused: Not on file   Family History  Family history unknown: Yes    OBJECTIVE:  Vitals:   04/03/20 1538  BP: 126/90  Pulse: (!) 110  Resp: 20  Temp: 98.7 F (37.1 C)  SpO2: 95%     General appearance: alert; appears fatigued, but nontoxic; speaking in full sentences and tolerating own secretions HEENT:  NCAT; Ears: EACs clear, TMs pearly gray; Eyes: PERRL.  EOM grossly intact. Sinuses: TTP; Nose: nares patent without rhinorrhea, Throat: oropharynx clear, tonsils non erythematous or enlarged, uvula midline  Neck: supple without LAD Lungs: unlabored respirations, symmetrical air entry; cough: mild; no respiratory distress; CTAB Heart: regular rate and rhythm.   Skin: warm and dry Psychological: alert and cooperative; normal mood and affect  ASSESSMENT & PLAN:  1. Encounter for screening for COVID-19   2. Cough   3. Acute non-recurrent sinusitis, unspecified location     Meds ordered this encounter  Medications  . levofloxacin (LEVAQUIN) 500 MG tablet    Sig: Take 1 tablet (500 mg total) by mouth daily.    Dispense:  5 tablet     Refill:  0    Order Specific Question:   Supervising Provider    Answer:   Eustace Moore [[id number]]  . benzonatate (TESSALON) 100 MG capsule    Sig: Take 1 capsule (100 mg total) by mouth every 8 (eight) hours.    Dispense:  21 capsule    Refill:  0    Order Specific Question:   Supervising Provider    Answer:   Eustace Moore [[id number]]   Exam within normal limits, heart rate mildly elevated.  Pulse ox 95% If you are concerned for blood clot please go to ED for further evaluation and management.  We are unable to rule that out in urgent care setting We will hold on x-ray today and trial a different antibiotic for possible sinus infection  COVID testing ordered.  It will take between 1-3 days for test results.  Someone will contact you regarding abnormal results.    In the meantime: You should remain isolated in your home for 10 days from symptom onset AND greater than 72 hours after symptoms resolution (absence of fever without the use of fever-reducing medication and improvement in respiratory symptoms), whichever is longer Get plenty of rest and push fluids Tessalon Perles prescribed for cough Levaquin for sinus infection Use OTC zyrtec for nasal congestion, runny nose, and/or sore throat Use OTC flonase for nasal congestion and runny nose Use medications daily for symptom relief Use OTC medications like ibuprofen or tylenol as needed fever or pain Call or go to the ED if you have any new or worsening symptoms such as fever, worsening cough, shortness of breath, chest tightness, chest pain, turning blue, changes in mental status, etc...   Reviewed expectations re: course of current medical issues. Questions answered. Outlined signs and symptoms indicating need for more acute intervention. Patient verbalized understanding. After Visit Summary given.         Rennis Harding, PA-C 04/03/20 1609

## 2020-04-04 LAB — NOVEL CORONAVIRUS, NAA: SARS-CoV-2, NAA: NOT DETECTED

## 2020-04-04 LAB — SARS-COV-2, NAA 2 DAY TAT

## 2020-05-26 ENCOUNTER — Encounter (HOSPITAL_COMMUNITY): Payer: Self-pay | Admitting: Emergency Medicine

## 2020-05-26 ENCOUNTER — Emergency Department (HOSPITAL_COMMUNITY): Payer: Commercial Managed Care - PPO

## 2020-05-26 ENCOUNTER — Emergency Department (HOSPITAL_COMMUNITY)
Admission: EM | Admit: 2020-05-26 | Discharge: 2020-05-26 | Disposition: A | Discharge status: Home / Self Care | Payer: Commercial Managed Care - PPO | Attending: Emergency Medicine | Admitting: Emergency Medicine

## 2020-05-26 ENCOUNTER — Other Ambulatory Visit: Payer: Self-pay

## 2020-05-26 DIAGNOSIS — E860 Dehydration: Secondary | ICD-10-CM | POA: Insufficient documentation

## 2020-05-26 DIAGNOSIS — R002 Palpitations: Secondary | ICD-10-CM | POA: Diagnosis present

## 2020-05-26 DIAGNOSIS — R52 Pain, unspecified: Secondary | ICD-10-CM

## 2020-05-26 DIAGNOSIS — U071 COVID-19: Secondary | ICD-10-CM | POA: Diagnosis not present

## 2020-05-26 LAB — CBC WITH DIFFERENTIAL/PLATELET
Abs Immature Granulocytes: 0.03 10*3/uL (ref 0.00–0.07)
Basophils Absolute: 0 10*3/uL (ref 0.0–0.1)
Basophils Relative: 0 %
Eosinophils Absolute: 0 10*3/uL (ref 0.0–0.5)
Eosinophils Relative: 0 %
HCT: 44 % (ref 39.0–52.0)
Hemoglobin: 14.3 g/dL (ref 13.0–17.0)
Immature Granulocytes: 1 %
Lymphocytes Relative: 13 %
Lymphs Abs: 0.6 10*3/uL — ABNORMAL LOW (ref 0.7–4.0)
MCH: 29.6 pg (ref 26.0–34.0)
MCHC: 32.5 g/dL (ref 30.0–36.0)
MCV: 91.1 fL (ref 80.0–100.0)
Monocytes Absolute: 0.1 10*3/uL (ref 0.1–1.0)
Monocytes Relative: 2 %
Neutro Abs: 4 10*3/uL (ref 1.7–7.7)
Neutrophils Relative %: 84 %
Platelets: 133 10*3/uL — ABNORMAL LOW (ref 150–400)
RBC: 4.83 MIL/uL (ref 4.22–5.81)
RDW: 12.2 % (ref 11.5–15.5)
WBC: 4.8 10*3/uL (ref 4.0–10.5)
nRBC: 0 % (ref 0.0–0.2)

## 2020-05-26 LAB — URINALYSIS, ROUTINE W REFLEX MICROSCOPIC
Bilirubin Urine: NEGATIVE
Glucose, UA: NEGATIVE mg/dL
Hgb urine dipstick: NEGATIVE
Ketones, ur: NEGATIVE mg/dL
Leukocytes,Ua: NEGATIVE
Nitrite: NEGATIVE
Protein, ur: NEGATIVE mg/dL
Specific Gravity, Urine: 1.018 (ref 1.005–1.030)
pH: 6 (ref 5.0–8.0)

## 2020-05-26 LAB — COMPREHENSIVE METABOLIC PANEL
ALT: 38 U/L (ref 0–44)
AST: 28 U/L (ref 15–41)
Albumin: 3.8 g/dL (ref 3.5–5.0)
Alkaline Phosphatase: 40 U/L (ref 38–126)
Anion gap: 6 (ref 5–15)
BUN: 10 mg/dL (ref 6–20)
CO2: 25 mmol/L (ref 22–32)
Calcium: 7.9 mg/dL — ABNORMAL LOW (ref 8.9–10.3)
Chloride: 104 mmol/L (ref 98–111)
Creatinine, Ser: 0.86 mg/dL (ref 0.61–1.24)
GFR, Estimated: >60 mL/min (ref >60)
Glucose, Bld: 115 mg/dL — ABNORMAL HIGH (ref 70–99)
Potassium: 4.7 mmol/L (ref 3.5–5.1)
Sodium: 135 mmol/L (ref 135–145)
Total Bilirubin: 0.5 mg/dL (ref 0.3–1.2)
Total Protein: 6.8 g/dL (ref 6.5–8.1)

## 2020-05-26 LAB — RESP PANEL BY RT-PCR (FLU A&B, COVID) ARPGX2
Influenza A by PCR: NEGATIVE
Influenza B by PCR: NEGATIVE
SARS Coronavirus 2 by RT PCR: POSITIVE — AB

## 2020-05-26 LAB — CK: Total CK: 42 U/L — ABNORMAL LOW (ref 49–397)

## 2020-05-26 MED ORDER — SODIUM CHLORIDE 0.9 % IV BOLUS
1000.0000 mL | Freq: Once | INTRAVENOUS | Status: AC
  Administered 2020-05-26: 1000 mL via INTRAVENOUS

## 2020-05-26 MED ORDER — ACETAMINOPHEN 500 MG PO TABS
1000.0000 mg | ORAL_TABLET | Freq: Once | ORAL | Status: AC
  Administered 2020-05-26: 1000 mg via ORAL
  Filled 2020-05-26: qty 2

## 2020-05-26 NOTE — ED Notes (Addendum)
CRITICAL VALUE ALERT  Critical Value:  COVID +  Date & Time Notied:  05/26/2020  Provider Notified: Adelina Mings, [place]  Orders Received/Actions taken: See chart

## 2020-05-26 NOTE — ED Notes (Signed)
Pt ambulated around the room. O2 remained above 91% on RA. Pt HR 135 when standing. Adelina Mings, PA notified.

## 2020-05-26 NOTE — ED Triage Notes (Addendum)
Pt reports day 6 of covid positive. Pt reports taking otc medication with no relief. Pt reports increased shortness of breath, 88% on room air, 98% 4 liters. Pt alert and oriented. nad noted. cbg 112.

## 2020-05-26 NOTE — Discharge Instructions (Addendum)
Your lab work and chest x-ray today were overall reassuring.  Your oxygen looks better here. Please continue to quarantine at home and monitor your symptoms closely. The virus should run its course in about 10-14 days. Please make sure you are drinking plenty of fluids. You can treat your symptoms supportively with tylenol for fevers and pains, and over the counter cough syrups and throat lozenges to help with cough. If your symptoms are not improving please follow up with you Primary doctor.   Please monitor your symptoms very closely over the next few days, and you have a persistent O2 sats below 90% that do not improve with sitting up or other worsening symptoms please return to the emergency department.  If you develop persistent fevers, shortness of breath or difficulty breathing, chest pain, severe headache and neck pain, persistent nausea and vomiting or other new or concerning symptoms return to the Emergency department.

## 2020-05-26 NOTE — ED Provider Notes (Signed)
[facility] EMERGENCY DEPARTMENT Provider Note   CSN: [id number] Arrival date & time: 05/26/20  1139     History Chief Complaint  Patient presents with  . Shortness of Breath    Kenneth Schneider is a 35 y.o. male.  Kenneth Schneider is a 35 y.o. male who is otherwise healthy, arrives via EMS for evaluation of worsening symptoms in the setting of current COVID-19 infection.  Patient tested positive for Covid 6 days ago via rapid antigen test at work.  Patient is a EMT locally.  He has been dealing with persistent fevers and body aches, as well as cough and headaches.  Patient reports increasing shortness of breath.  He has been monitoring his oxygen closely at home and last night started feeling worse, was trying to rest, but kept waking up.  At one point he checked his oxygen while he was laying down and it was 88%, when he sat up this seemed to improve.  But he continued to feel poorly so called EMS.  He was initially noted to have sats of 88% on room air, they placed him on 4 L with improvement.  He also reports nausea for which she has been taking Zofran, and very poor appetite.  Patient reports he has been having persistent and worsening body aches.  Patient has not received any Covid vaccination.  Has not been on any steroids antibiotics or received any antibody infusions.        History reviewed. No pertinent past medical history.  There are no problems to display for this patient.   Past Surgical History:  Procedure Laterality Date  . TONSILLECTOMY         Family History  Family history unknown: Yes    Social History   Tobacco Use  . Smoking status: Never Smoker  . Smokeless tobacco: Never Used  Substance Use Topics  . Alcohol use: Yes    Comment: rarely   . Drug use: No    Home Medications Prior to Admission medications   Medication Sig Start Date End Date Taking? Authorizing Provider  benzonatate (TESSALON) 100 MG capsule Take 1 capsule (100 mg total) by  mouth every 8 (eight) hours. Patient not taking: No sig reported 04/03/20   Wurst, [place], PA-C  levofloxacin (LEVAQUIN) 500 MG tablet Take 1 tablet (500 mg total) by mouth daily. Patient not taking: No sig reported 04/03/20   Wurst, [place], PA-C    Allergies    Codeine, Ibuprofen, Penicillins, and Vancomycin  Review of Systems   Review of Systems  Constitutional: Positive for chills, fatigue and fever.  HENT: Positive for congestion and rhinorrhea. Negative for sore throat.   Respiratory: Positive for cough and shortness of breath.   Cardiovascular: Negative for chest pain.  Gastrointestinal: Positive for nausea. Negative for abdominal pain, diarrhea and vomiting.  Genitourinary: Negative for dysuria.  Musculoskeletal: Positive for arthralgias and myalgias.  Skin: Negative for color change and rash.  Neurological: Positive for headaches. Negative for dizziness, syncope and light-headedness.  All other systems reviewed and are negative.   Physical Exam Updated Vital Signs BP (!) 117/92   Pulse (!) 111   Temp 100 F (37.8 C) (Oral)   Resp 20   Ht 5\' 11"  (1.803 m)   Wt 105.7 kg   SpO2 96%   BMI 32.50 kg/m   Physical Exam Vitals and nursing note reviewed.  Constitutional:      General: He is not in acute distress.    Appearance: He  is well-developed and well-nourished. He is obese. He is not diaphoretic.     Comments: Patient is alert, mildly ill-appearing but in no acute distress.  HENT:     Head: Normocephalic and atraumatic.     Mouth/Throat:     Mouth: Oropharynx is clear and moist.     Comments: Mucous membranes dry Eyes:     General:        Right eye: No discharge.        Left eye: No discharge.     Extraocular Movements: EOM normal.     Pupils: Pupils are equal, round, and reactive to light.  Cardiovascular:     Rate and Rhythm: Regular rhythm. Tachycardia present.     Pulses: Intact distal pulses.     Heart sounds: Normal heart sounds. No murmur  heard. No friction rub. No gallop.      Comments: Mildly tachycardic on arrival Pulmonary:     Effort: Pulmonary effort is normal. No respiratory distress.     Breath sounds: Normal breath sounds. No wheezing or rales.     Comments: On room air respirations are equal and unlabored, patient able to speak in full sentences and maintained O2 sats of 96%, on auscultation, slightly diminished breath sounds, but no focal wheezes, rales or rhonchi. Chest:     Chest wall: No tenderness.  Abdominal:     General: Bowel sounds are normal. There is no distension.     Palpations: Abdomen is soft. There is no mass.     Tenderness: There is no abdominal tenderness. There is no guarding.     Comments: Abdomen soft, nondistended, nontender to palpation in all quadrants without guarding or peritoneal signs  Musculoskeletal:        General: No deformity or edema.     Cervical back: Neck supple.     Right lower leg: No tenderness. No edema.     Left lower leg: No tenderness. No edema.  Skin:    General: Skin is warm and dry.     Capillary Refill: Capillary refill takes less than 2 seconds.  Neurological:     Mental Status: He is alert.     Coordination: Coordination normal.     Comments: Speech is clear, able to follow commands Moves extremities without ataxia, coordination intact  Psychiatric:        Mood and Affect: Mood normal.        Behavior: Behavior normal.     ED Results / Procedures / Treatments   Labs (all labs ordered are listed, but only abnormal results are displayed) Labs Reviewed  RESP PANEL BY RT-PCR (FLU A&B, COVID) ARPGX2 - Abnormal; Notable for the following components:      Result Value   SARS Coronavirus 2 by RT PCR POSITIVE (*)    All other components within normal limits  COMPREHENSIVE METABOLIC PANEL - Abnormal; Notable for the following components:   Glucose, Bld 115 (*)    Calcium 7.9 (*)    All other components within normal limits  CBC WITH DIFFERENTIAL/PLATELET -  Abnormal; Notable for the following components:   Platelets 133 (*)    Lymphs Abs 0.6 (*)    All other components within normal limits  CK - Abnormal; Notable for the following components:   Total CK 42 (*)    All other components within normal limits  URINALYSIS, ROUTINE W REFLEX MICROSCOPIC    EKG None  Radiology DG Chest Portable 1 View  Result Date: 05/26/2020 CLINICAL DATA:  35 year old male with history of shortness of breath. EXAM: PORTABLE CHEST 1 VIEW COMPARISON:  Chest x-ray 12/06/2007. FINDINGS: Lung volumes are normal. No consolidative airspace disease. No pleural effusions. No pneumothorax. No pulmonary nodule or mass noted. Pulmonary vasculature and the cardiomediastinal silhouette are within normal limits. IMPRESSION: No radiographic evidence of acute cardiopulmonary disease. Electronically Signed   By: Vinnie Langton M.D.   On: 05/26/2020 12:15    Procedures Procedures (including critical care time)  Medications Ordered in ED Medications  sodium chloride 0.9 % bolus 1,000 mL (0 mLs Intravenous Stopped 05/26/20 1308)  acetaminophen (TYLENOL) tablet 1,000 mg (1,000 mg Oral Given 05/26/20 1318)    ED Course  I have reviewed the triage vital signs and the nursing notes.  Pertinent labs & imaging results that were available during my care of the patient were reviewed by me and considered in my medical decision making (see chart for details).    MDM Rules/Calculators/A&P                         35 year old male presents with known Covid infection on day 6 of symptoms, unvaccinated reporting increasing shortness of breath, persistent fevers, severe body aches and concern for dehydration.  On arrival he is mildly tachycardic with low-grade fever, was initially brought in on 4 L nasal cannula with EMS, but patient placed on room air and has maintained normal O2 sats without significant increased work of breathing.  Initially when patient was ambulated on room air his O2 sats  remained above 91%, but he did become tachycardic to the 130s.  Patient reports he has had persistent fevers for which she has been taking Tylenol, but has had severe body aches that have been worsening, and he has had very little appetite or p.o. intake.  He has noticed darkening of his urine and is concerned he is becoming dehydrated.  Given worsening body aches patient also raises concern for rhabdo.  Will check basic labs and CK.  Will confirm Covid status given it is not present in our system.  We will also check chest x-ray.  I have independently ordered, reviewed and interpreted all labs and imaging: CBC: No leukocytosis, normal hemoglobin, mild thrombocytopenia with platelets of 130. CMP: Glucose 115, calcium 7.9 but no other electrolyte derangements and normal renal and liver function CK: Not elevated at all, no signs of rhabdo UA: No signs of infection, no hematuria Confirmed Covid positive  Chest x-ray with no evidence of Covid pneumonia or other active cardiopulmonary disease.  After fluids patient's tachycardia has resolved, and he states that he is feeling much better.  He has been monitored throughout his ED stay and has had no episodes of hypoxia and continues to have normal work of breathing.  At this time patient does not meet criteria for admission, but discussed with him that he has high risk of worsening and should continue to monitor his symptoms closely and return for any worsening symptoms or persistent hypoxia.  He expresses understanding and agreement.  Discharged home in good condition.  Graylon Amory Yurko was evaluated in Emergency Department on 05/26/2020 for the symptoms described in the history of present illness. He was evaluated in the context of the global COVID-19 pandemic, which necessitated consideration that the patient might be at risk for infection with the SARS-CoV-2 virus that causes COVID-19. Institutional protocols and algorithms that pertain to the evaluation of  patients at risk for COVID-19 are in a state of  rapid change based on information released by regulatory bodies including the CDC and federal and state organizations. These policies and algorithms were followed during the patient's care in the ED.  Final Clinical Impression(s) / ED Diagnoses Final diagnoses:  COVID-19 virus infection  Dehydration  Generalized body aches    Rx / DC Orders ED Discharge Orders    None       Legrand Rams 05/26/20 2237    Bethann Berkshire, MD 05/31/20 1038
